# Patient Record
Sex: Male | Born: 1980 | Race: White | Marital: Single | Smoking: Never smoker
Health system: Southern US, Community
[De-identification: ages and names within clinical notes are randomized; demographics above are authoritative.]

---

## 2015-07-16 ENCOUNTER — Encounter: Payer: Self-pay | Admitting: Emergency Medicine

## 2015-07-16 ENCOUNTER — Emergency Department
Admission: EM | Admit: 2015-07-16 | Discharge: 2015-07-16 | Disposition: A | Discharge status: Home / Self Care | Payer: Self-pay | Attending: Emergency Medicine | Admitting: Emergency Medicine

## 2015-07-16 ENCOUNTER — Emergency Department: Payer: Self-pay

## 2015-07-16 DIAGNOSIS — Y9364 Activity, baseball: Secondary | ICD-10-CM | POA: Insufficient documentation

## 2015-07-16 DIAGNOSIS — Y998 Other external cause status: Secondary | ICD-10-CM | POA: Insufficient documentation

## 2015-07-16 DIAGNOSIS — Y92320 Baseball field as the place of occurrence of the external cause: Secondary | ICD-10-CM | POA: Insufficient documentation

## 2015-07-16 DIAGNOSIS — S62031A Displaced fracture of proximal third of navicular [scaphoid] bone of right wrist, initial encounter for closed fracture: Secondary | ICD-10-CM | POA: Insufficient documentation

## 2015-07-16 DIAGNOSIS — S62001A Unspecified fracture of navicular [scaphoid] bone of right wrist, initial encounter for closed fracture: Secondary | ICD-10-CM

## 2015-07-16 DIAGNOSIS — W1839XA Other fall on same level, initial encounter: Secondary | ICD-10-CM | POA: Insufficient documentation

## 2015-07-16 DIAGNOSIS — S63501A Unspecified sprain of right wrist, initial encounter: Secondary | ICD-10-CM | POA: Insufficient documentation

## 2015-07-16 DIAGNOSIS — S62021A Displaced fracture of middle third of navicular [scaphoid] bone of right wrist, initial encounter for closed fracture: Secondary | ICD-10-CM | POA: Insufficient documentation

## 2015-07-16 MED ORDER — OXYCODONE-ACETAMINOPHEN 5-325 MG PO TABS
1.0000 | ORAL_TABLET | Freq: Three times a day (TID) | ORAL | Status: DC | PRN
  Administered 2015-07-16: 1 via ORAL
  Filled 2015-07-16: qty 1

## 2015-07-16 MED ORDER — OXYCODONE-ACETAMINOPHEN 5-325 MG PO TABS: 1.0000 | ORAL_TABLET | Freq: Three times a day (TID) | ORAL | Status: AC | PRN

## 2015-07-16 NOTE — ED Notes (Signed)
Pt states while playing baseball he fell backwards on his right hand. Pain to right wrist. Color WNL.

## 2015-07-16 NOTE — ED Provider Notes (Signed)
[facility] Emergency Department Provider Note ____________________________________________  Time seen: 16  I have reviewed the triage vital signs and the nursing notes.  HISTORY  Chief Complaint  Wrist Pain  HPI Luke Gray is a 34 y.o. male presents to the ED for evaluation and management of pain to the right wrist. He noted immediate pain and disability to the right wrist after he sustained a backwards FOOSH injury. He describes playing baseball today when one of his teammates axially stepped on his foot, causing him to fall backwards on outstretched hand. He had immediate pain to the radial wrist at that time. Since the incident he has noted increased pain and swelling to the base of the thumb. He denies any other injury at this time. He is right-hand dominant. He rates his pain at a 9/10 in triage.  History reviewed. No pertinent past medical history.  There are no active problems to display for this patient.  History reviewed. No pertinent past surgical history.  Current Outpatient Rx  Name  Route  Sig  Dispense  Refill  . oxyCODONE-acetaminophen (ROXICET) 5-325 MG per tablet   Oral   Take 1 tablet by mouth every 8 (eight) hours as needed.   10 tablet   0    Allergies Sulfa antibiotics  No family history on file.  Social History History  Substance Use Topics  . Smoking status: Never Smoker   . Smokeless tobacco: Current User    Types: Chew  . Alcohol Use: Yes     Comment: occ.   Review of Systems  Constitutional: Negative for fever. Eyes: Negative for visual changes. ENT: Negative for sore throat. Cardiovascular: Negative for chest pain. Respiratory: Negative for shortness of breath. Gastrointestinal: Negative for abdominal pain, vomiting and diarrhea. Genitourinary: Negative for dysuria. Musculoskeletal: Negative for back pain. Right wrist pain Skin: Negative for rash. Neurological: Negative for headaches, focal weakness or  numbness. ____________________________________________  PHYSICAL EXAM:  VITAL SIGNS: ED Triage Vitals  Enc Vitals Group     BP 07/16/15 1720 140/93 mmHg     Pulse Rate 07/16/15 1720 98     Resp 07/16/15 1720 18     Temp 07/16/15 1720 98.2 F (36.8 C)     Temp Source 07/16/15 1720 Oral     SpO2 07/16/15 1720 96 %     Weight 07/16/15 1720 210 lb (95.255 kg)     Height 07/16/15 1720  (1.854 m)     Head Cir --      Peak Flow --      Pain Score 07/16/15 1721 9     Pain Loc --      Pain Edu? --      Excl. in GC? --    Constitutional: Alert and oriented. Well appearing and in no distress. HEENT: Normocephalic and atraumatic. Conjunctivae are normal. PERRL. Normal extraocular movements. Cardiovascular: Normal distal pulses  Respiratory: Normal respiratory effort.  Musculoskeletal: Right wrist with minimal radial swelling. Patient tender to palp over the anatomical snuffbox. Normal composite fist except for increased pain. Neurologic:  CN II-XII grossly intact. Normal intrinsic/opposition testing. Normal gait without ataxia. Normal speech and language. No gross focal neurologic deficits are appreciated. Skin:  Skin is warm, dry and intact. No rash noted. Psychiatric: Mood and affect are normal. Patient exhibits appropriate insight and judgment. ____________________________________________   RADIOLOGY IMPRESSION: Comminuted fracture at the junction of proximal and mid thirds of the scaphoid with displaced fracture fragments. No dislocation. No other fractures. No  appreciable arthropathic change.  I, Luke Gray, Luke Gray, personally viewed and evaluated these images as part of my medical decision making.  ____________________________________________  PROCEDURES  Thumb spica OCL Percocet 5-325 mg PO ____________________________________________  INITIAL IMPRESSION / ASSESSMENT AND PLAN / ED COURSE  Radiology results to patient. Initial fracture care provided for  comminuted, displaced scaphoid fracture. Discussed radiology results with Dr. Hyacinth Meeker. He will see the patient in the office this week.  ____________________________________________  FINAL CLINICAL IMPRESSION(S) / ED DIAGNOSES  Final diagnoses:  Scaphoid fracture of wrist, right, closed, initial encounter  Wrist sprain, right, initial encounter      Lissa Hoard, PA-C 07/16/15 1850  Sharyn Creamer, MD 07/17/15 [phone]

## 2015-07-16 NOTE — ED Notes (Signed)
AAOx3.  Skin warm and dry.  NAD.  D/C home 

## 2015-07-16 NOTE — Discharge Instructions (Signed)
Scaphoid Fracture, Wrist A fracture is a break in the bone. The bone you have broken often does not show up as a fracture on x-ray until later on in the healing phase. This bone is called the scaphoid bone. With this bone, your caregiver will often cast or splint your wrist as though it is fractured, even if a fracture is not seen on the x-ray. This is often done with wrist injuries in which there is tenderness at the base of the thumb. An x-ray at 1-3 weeks after your injury may confirm this fracture. A cast or splint is used to protect and keep your injured bone in good position for healing. The cast or splint will be on generally for about 6 to 16 weeks, depending on your health, age, the fracture location and how quickly you heal. Another name for the scaphoid bone is the navicular bone. HOME CARE INSTRUCTIONS   To lessen the swelling and pain, keep the injured part elevated above your heart while sitting or lying down.  Apply ice to the injury for 15-20 minutes, 03-04 times per day while awake, for 2 days. Put the ice in a plastic bag and place a thin towel between the bag of ice and your cast.  If you have a plaster or fiberglass cast or splint:  Do not try to scratch the skin under the cast using sharp or pointed objects.  Check the skin around the cast every day. You may put lotion on any red or sore areas.  Keep your cast or splint dry and clean.  If you have a plaster splint:  Wear the splint as directed.  You may loosen the elastic bandage around the splint if your fingers become numb, tingle, or turn cold or blue.  If you have been put in a removable splint, wear and use as directed.  Do not put pressure on any part of your cast or splint; it may deform or break. Rest your cast or splint only on a pillow the first 24 hours until it is fully hardened.  Your cast or splint can be protected during bathing with a plastic bag. Do not lower the cast or splint into water.  Only take  over-the-counter or prescription medicines for pain, discomfort, or fever as directed by your caregiver.  If your caregiver has given you a follow up appointment, it is very important to keep that appointment. Not keeping the appointment could result in chronic pain and decreased function. If there is any problem keeping the appointment, you must call back to this facility for assistance. SEEK IMMEDIATE MEDICAL CARE IF:   Your cast gets damaged, wet or breaks.  You have continued severe pain or more swelling than you did before the cast or splint was put on.  Your skin or nails below the injury turn blue or gray, or feel cold or numb.  You have tingling or burning pain in your fingers or increasing pain with movement of your fingers Document Released: 11/29/2002 Document Revised: 03/02/2012 Document Reviewed: 07/28/2009 [facility] Patient Information 2015 [place], [place]. This information is not intended to replace advice given to you by your health care provider. Make sure you discuss any questions you have with your health care provider.  Keep the splint in place until evaluated by Dr. Hyacinth Meeker.  Apply ice to wrist through the splint. Take the pain medicine as needed for pain relief. Limited use of the right hand due to the splint.  Keep the splint clean and  dry.

## 2016-07-30 IMAGING — CR DG WRIST COMPLETE 3+V*R*
1 series · 4 of 4 positions shown · non-contrast
Comparison: None.

CLINICAL DATA: Pain following fall

EXAM:
RIGHT WRIST - COMPLETE 3+ VIEW

[Series 1: x wrist pa right · 0.14mm/px · 4 of 4 slices shown]
[im 1/4]
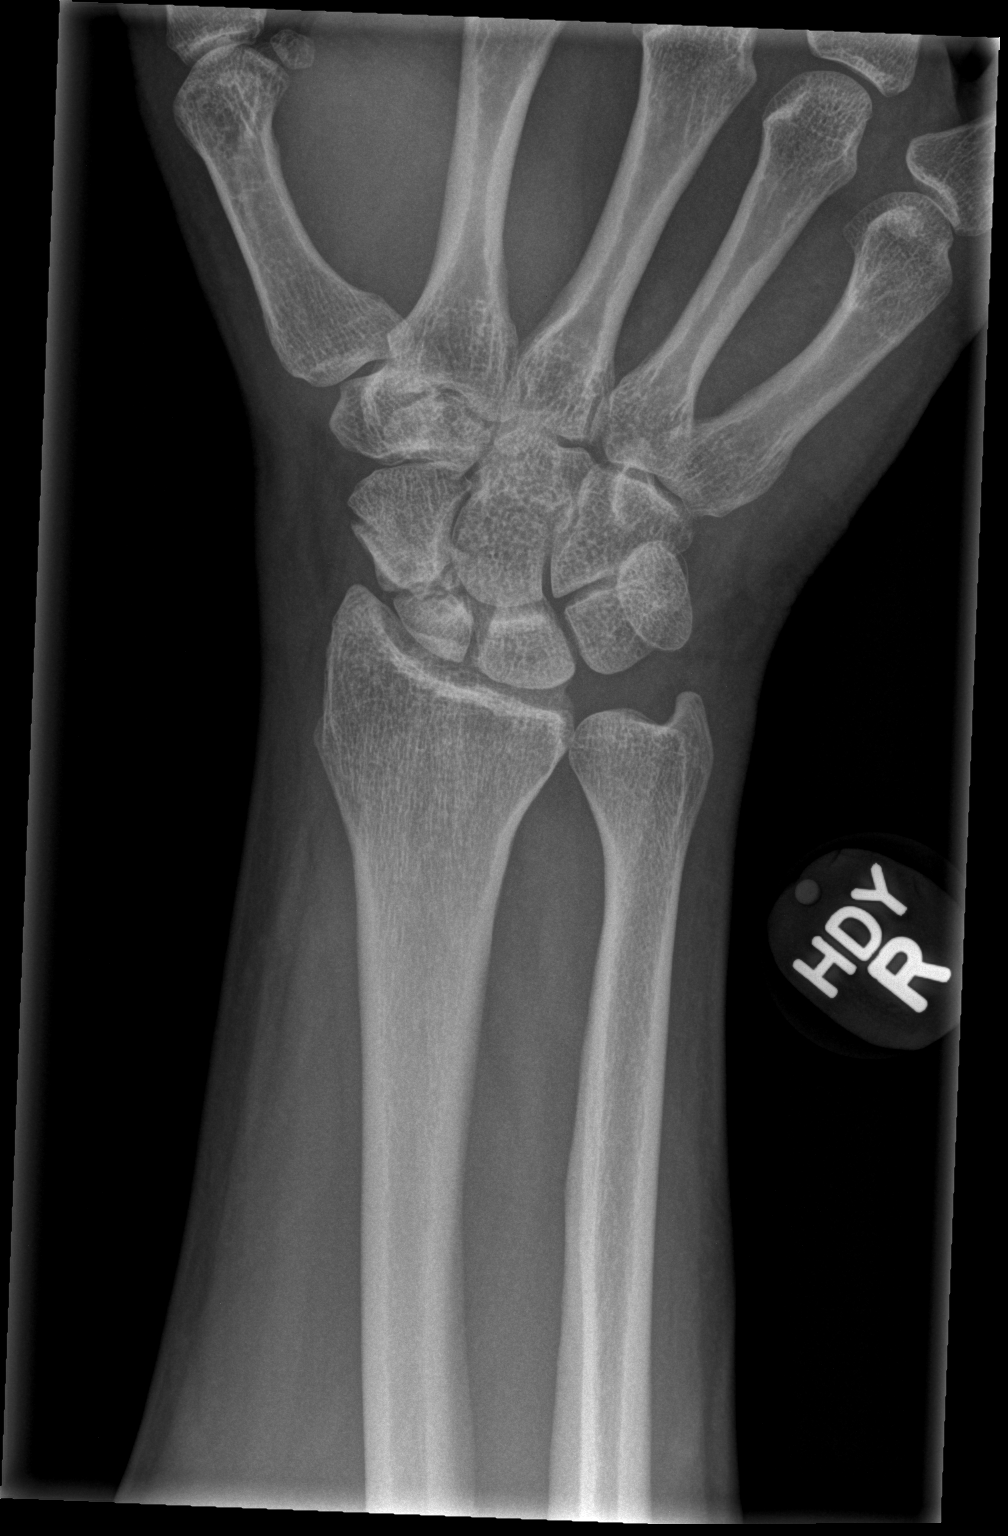
[im 2/4]
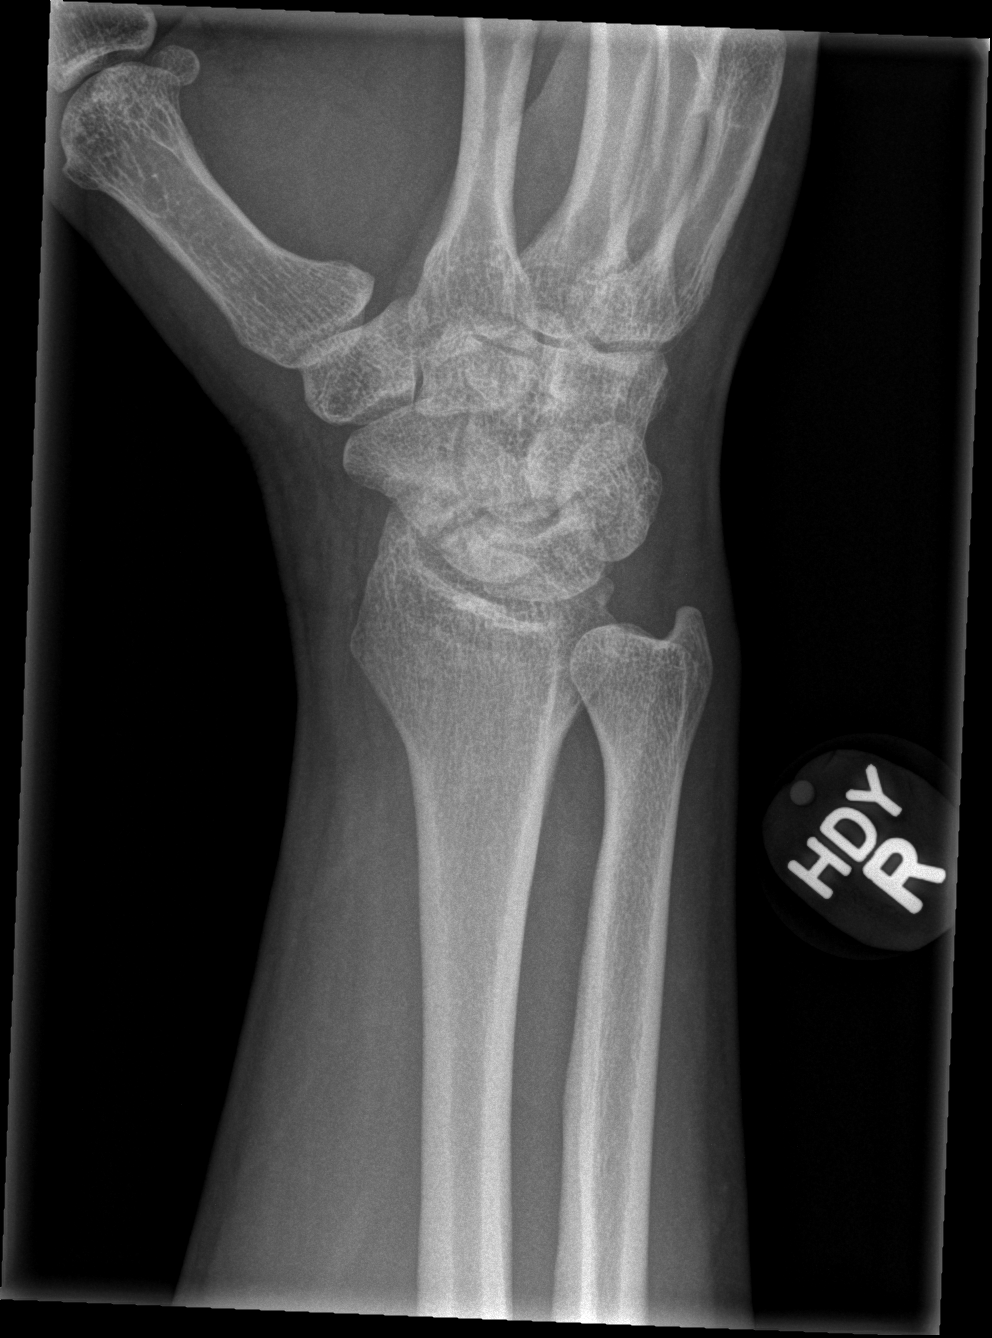
[im 3/4]
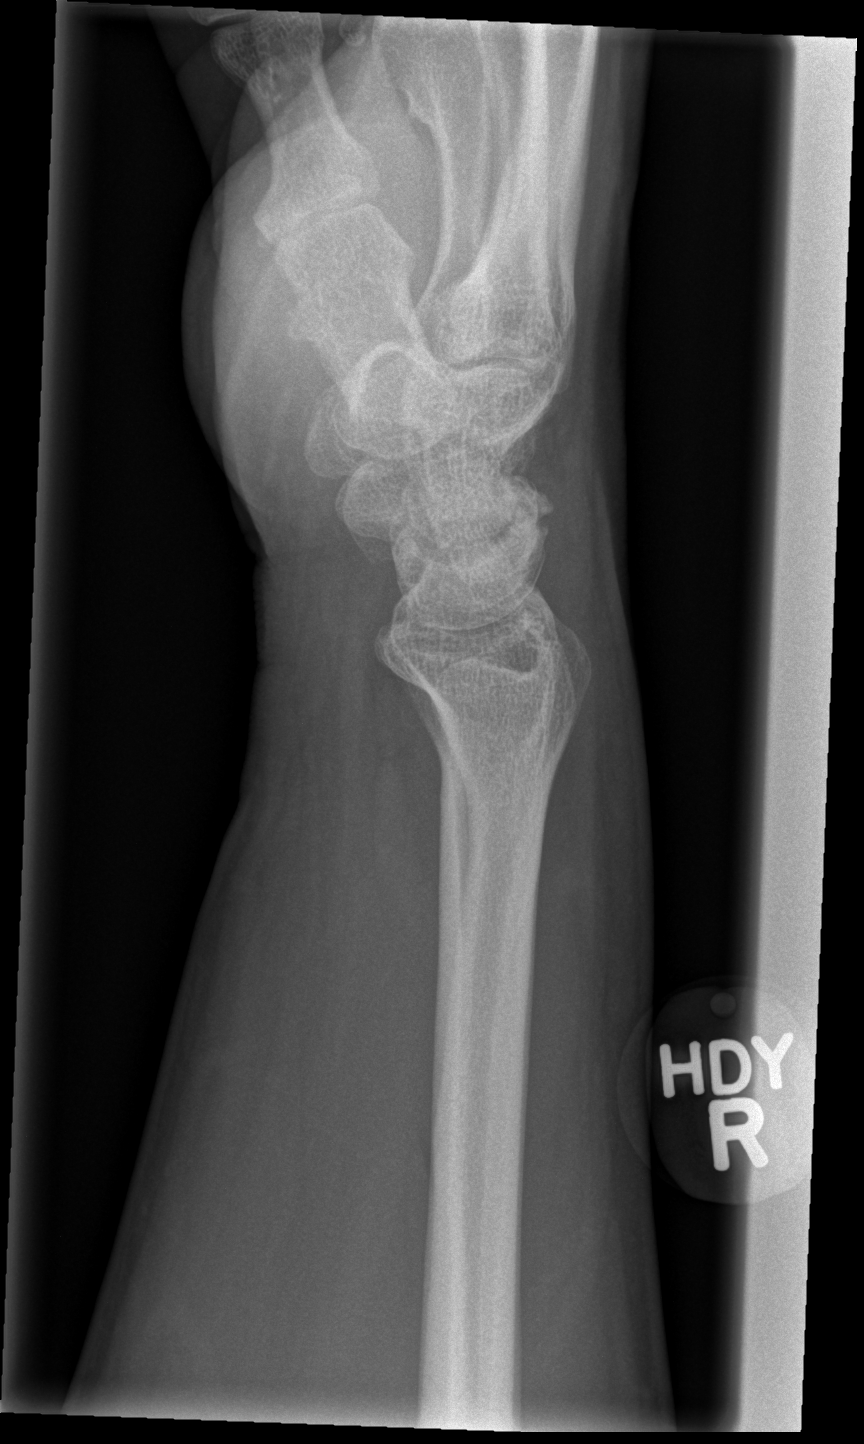
[im 4/4]
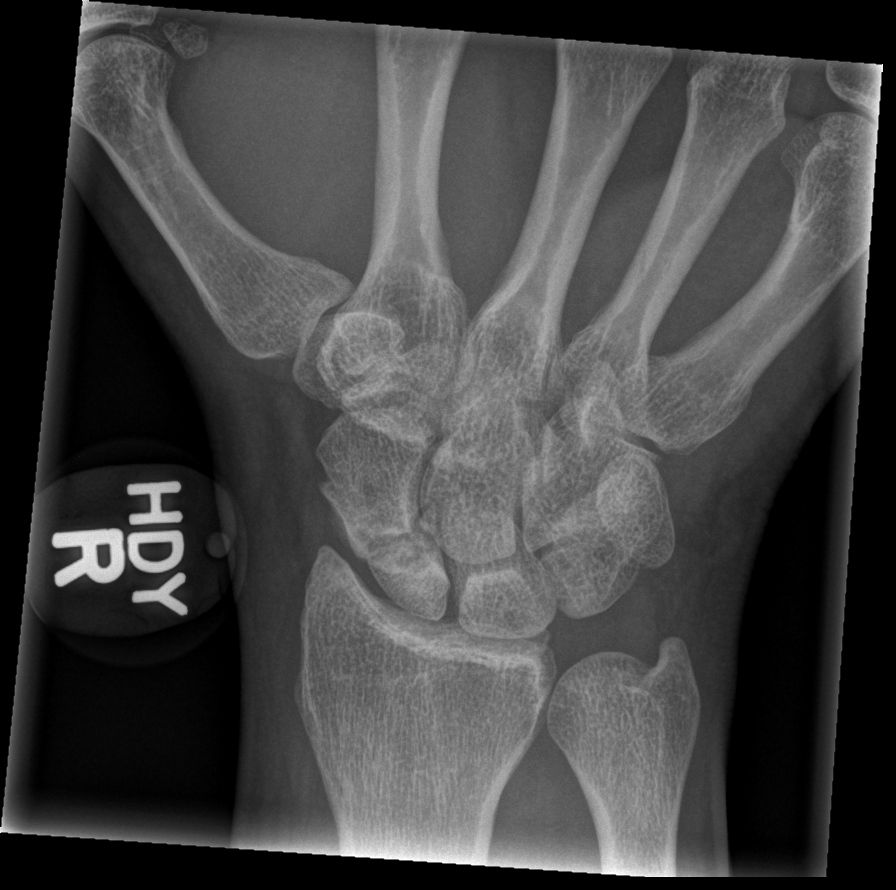

[4 of 4 positions shown; findings below may reference images not displayed]

FINDINGS: Frontal, oblique, lateral, and ulnar deviation scaphoid images were
obtained. There is a comminuted fracture of the scaphoid at the
junction of the proximal and mid thirds with several displaced
fracture fragments. No other fractures. No dislocations. Joint
spaces appear intact. No erosive change.
IMPRESSION: Comminuted fracture at the junction of proximal and mid thirds of
the scaphoid with displaced fracture fragments. No dislocation. No
other fractures. No appreciable arthropathic change.
